# Patient Record
Sex: Male | Born: 2005
Health system: Southern US, Community
[De-identification: ages and names within clinical notes are randomized; demographics above are authoritative.]

## PROBLEM LIST (undated history)

## (undated) DIAGNOSIS — T7840XA Allergy, unspecified, initial encounter: Secondary | ICD-10-CM

## (undated) DIAGNOSIS — F909 Attention-deficit hyperactivity disorder, unspecified type: Secondary | ICD-10-CM

## (undated) HISTORY — DX: Allergy, unspecified, initial encounter: T78.40XA

---
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
## 2023-02-02 ENCOUNTER — Ambulatory Visit (INDEPENDENT_AMBULATORY_CARE_PROVIDER_SITE_OTHER): Payer: BC Managed Care – PPO | Admitting: Internal Medicine

## 2023-02-02 ENCOUNTER — Encounter: Payer: Self-pay | Admitting: Internal Medicine

## 2023-02-02 VITALS — BP 120/68 | HR 81 | Temp 97.9°F | Resp 16 | Ht 67.0 in | Wt 150.5 lb

## 2023-02-02 DIAGNOSIS — T7800XA Anaphylactic reaction due to unspecified food, initial encounter: Secondary | ICD-10-CM

## 2023-02-02 NOTE — Progress Notes (Signed)
New Patient Note  RE: Randy [name] MRN: [id number] DOB: July 18, 2006 Date of Office Visit: 02/02/2023  Consult requested by: Harrie Jeans, MD Primary care provider: Harrie Jeans, MD  Chief Complaint: Allergic Reaction (To certain fish and cantaloupe. Social research officer, government entry. )  History of Present Illness: I had the pleasure of seeing Randy [name] for initial evaluation at the [facility] on 02/02/2023. He is a 17 y.o. male, who is referred here by Harrie Jeans, MD for the evaluation of food allergies in the setting of enlisting in the [place] .  History obtained from patient, chart review.  Concern for Food Allergy:  Food of concern: canteloupe, honeydew, Tilapia  History of reaction: facial swelling and rash,  Previous allergy testing yes previously allergic to peanut butter, but eats this now. Has not had testing for canteloupe or fish  Eats egg, dairy, wheat, soy, shellfish, peanuts, tree nuts, sesame without reactions Tolerates salmon, sushi (unknown fish)  Carries an epinephrine autoinjector: yes (not up to date)  Has food allergy action plan no   Assessment and Plan: Larrell is a 17 y.o. male with: Allergy with anaphylaxis due to food - Plan: Allergen Tilapia f414, Cantaloupe IgE   Plan: Patient Instructions  According to DoDi 6130.03 section 6.23 Food allergy is disqualifying if patient has a "History of acute allergic reaction to fish, crustaceans, shellfish, peanuts, or tree nuts  including the presence of a food-specific immunoglobulin E antibody if accompanied by a  correlating clinical history"  Based on history of reactions: Will get sIgE and plan for food challenges pending results  Follow up: we will contact you with lab results   Thank you so much for letting me partake in your care today.  Don't hesitate to reach out if you have any additional concerns!  Roney Marion, MD  [facility] [place] [place]   No orders of the defined  types were placed in this encounter.  Lab Orders         Allergen Tilapia f414         Cantaloupe IgE      Other allergy screening: Asthma: no Rhino conjunctivitis: no Food allergy: yes Medication allergy: no Hymenoptera allergy: no Urticaria: no Eczema:no History of recurrent infections suggestive of immunodeficency: no  Diagnostics: None done    Past Medical History: Patient Active Problem List   Diagnosis Date Noted   Attention and concentration deficit 03/24/2011   ALLERGY, FOOD 05/19/2010   ECZEMA 01/23/2009   Past Medical History:  Diagnosis Date   ADHD (attention deficit hyperactivity disorder)    Allergy    Multiple food allergies   Past Surgical History: History reviewed. No pertinent surgical history. Medication List:  Current Outpatient Medications  Medication Sig Dispense Refill   ibuprofen (ADVIL) 600 MG tablet Take 1 tablet (600 mg total) by mouth every 8 (eight) hours as needed for up to 30 doses for mild pain or moderate pain. (Patient not taking: Reported on 04/05/2022) 30 tablet 0   ibuprofen (ADVIL) 800 MG tablet Take 1 tablet (800 mg total) by mouth 3 (three) times daily. (Patient not taking: Reported on 02/02/2023) 21 tablet 0   lidocaine (LIDODERM) 5 % Place 1 patch onto the skin daily. Remove & Discard patch within 12 hours or as directed by MD (Patient not taking: Reported on 02/02/2023) 30 patch 0   No current facility-administered medications for this visit.   Allergies: Allergies  Allergen Reactions   Fish-Derived Products  Rash   Other Rash    Melon    Social History: Social History   Socioeconomic History   Marital status: Single    Spouse name: Not on file   Number of children: Not on file   Years of education: Not on file   Highest education level: Not on file  Occupational History   Not on file  Tobacco Use   Smoking status: Never    Passive exposure: Never   Smokeless tobacco: Never  Vaping Use   Vaping Use: Never used   Substance and Sexual Activity   Alcohol use: Not Currently    Alcohol/week: 1.0 - 2.0 standard drink of alcohol    Types: 1 - 2 Standard drinks or equivalent per week    Comment: socailly   Drug use: Not Currently   Sexual activity: Not Currently  Other Topics Concern   Not on file  Social History Narrative   Not on file   Social Determinants of Health   Financial Resource Strain: Not on file  Food Insecurity: Not on file  Transportation Needs: Not on file  Physical Activity: Not on file  Stress: Not on file  Social Connections: Not on file   Lives in a single-family home that is 19 years old.  No roaches in the house and bed is to get off floor.  No dust mite precautions.  Not exposed to fumes, chemicals or dust.  Home is not near an interstate or industrial area. 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   Objective: BP 120/68   Pulse 81   Temp 97.9 F (36.6 C) (Temporal)   Resp 16   Ht '5\' 7"'$  (1.702 m)   Wt 150 lb 8 oz (68.3 kg)   SpO2 99%   BMI 23.57 kg/m  Body mass index is 23.57 kg/m.  General Appearance:  Alert, cooperative, no distress, appears stated age  Head:  Normocephalic, without obvious abnormality, atraumatic  Eyes:  Conjunctiva clear, EOM's intact  Nose: Nares normal,   Throat: Lips, tongue normal; teeth and gums normal,   Neck: Supple, symmetrical  Lungs:   , Respirations unlabored, no coughing  Heart:  , Appears well perfused  Extremities: No edema  Skin: Skin color, texture,  turgor normal, no rashes or lesions on visualized portions of skin  Neurologic: No gross deficits   The plan was reviewed with the patient/family, and all questions/concerned were addressed.  It was my pleasure to see Randy [name] today and participate in his care. Please feel free to contact me with any questions or concerns.  Sincerely,  Roney Marion, MD Allergy & Immunology  [facility] office: [phone] [facility] office: [phone]

## 2023-02-02 NOTE — Patient Instructions (Signed)
According to DoDi 6130.03 section 6.23 Food allergy is disqualifying if patient has a "History of acute allergic reaction to fish, crustaceans, shellfish, peanuts, or tree nuts  including the presence of a food-specific immunoglobulin E antibody if accompanied by a  correlating clinical history"  Based on history of reactions: Will get sIgE and plan for food challenges pending results  Follow up: we will contact you with lab results   Thank you so much for letting me partake in your care today.  Don't hesitate to reach out if you have any additional concerns!  Roney Marion, MD  [facility] [place], [place]

## 2023-02-07 ENCOUNTER — Ambulatory Visit: Payer: Self-pay | Admitting: Internal Medicine

## 2023-02-07 LAB — ALLERGEN CANTALOUPE: Allergen Melon IgE: <0.1 kU/L

## 2023-02-07 LAB — ALLERGEN TILAPIA F414: Allergen Tilapia IgE: <0.1 kU/L

## 2023-02-07 NOTE — Progress Notes (Signed)
Blood work for tilapia and canteloupe was negative.  We need to schedule a 2 food challenges: tilapia first and then canteloupe.  He will need to bring a serving size of both.  Can someone contact patient and let them know?

## 2023-02-09 ENCOUNTER — Telehealth: Payer: Self-pay | Admitting: Internal Medicine

## 2023-02-09 NOTE — Telephone Encounter (Signed)
Tried calling pt but voicemail box is full looks like Randy [name] talked to pts mom about the results and then scheduling a challenge waiting on pt to call us back about challenge

## 2023-02-09 NOTE — Telephone Encounter (Signed)
Patient returning a call  about lab results 

## 2023-02-21 ENCOUNTER — Ambulatory Visit: Payer: Self-pay | Admitting: Internal Medicine

## 2023-02-27 NOTE — Patient Instructions (Signed)
In office oral challenge to tilapia Randy [name] was able to tolerate the tilapia food challenge today at the office without adverse signs or symptoms of an allergic reaction. Therefore, he has the same risk of systemic reaction associated with the consumption of tilapia as the general population.  - Do not give any tilapia  for the next 24 hours. - Monitor for allergic symptoms such as rash, wheezing, diarrhea, swelling, and vomiting for the next 24 hours. If severe symptoms occur, treat with EpiPen injection and call 911. For less severe symptoms treat with Benadryl 50 mg every 4 hours and call the clinic.  - If no allergic symptoms are evident, reintroduce tilapia  into the diet. If he develops an allergic reaction to tilapia , record what was eaten the amount eaten, preparation method, time from ingestion to reaction, and symptoms.   - Return to the clinic for a cantaloupe challenge. Remember to stop taking any antihistamines for 3 days before the challenge.  Call the clinic if this treatment plan is not working well for you  Follow up in 1 week or sooner if needed.

## 2023-02-27 NOTE — Progress Notes (Signed)
   [address] [place] [address] Dept: [phone]  FOLLOW UP NOTE  Patient ID: Randy [name], male    DOB: [birth year]  Age: 17 y.o. MRN: [id number] Date of Office Visit: 02/28/2023  Assessment  Chief Complaint: No chief complaint on file.  HPI Randy [name] is an 17 year old male who presents to the clinic for a follow up visit with a food challenge to tilapia. He was last seen in this clinic on 02/02/2023 by Dr. Marlynn Perking for evaluation of food allergy to tilapia and cantaloupe. He had negative IgE to tilapia and cantaloupe on 02/07/2023.    Drug Allergies:  Allergies  Allergen Reactions   Fish-Derived Products Rash   Other Rash    Melon     Physical Exam: There were no vitals taken for this visit.   Physical Exam  Diagnostics: Procedure note: Writtenc onsent obtained {Blank single:19197::"Open graded *** challenge","Open graded *** oral challenge"}: The patient was able to tolerate the challenge today without adverse signs or symptoms. Vital signs were stable throughout the challenge and observation period. He received multiple doses separated by {Blank single:19197::"30 minutes","20 minutes","15 minutes","10 minutes"}, each of which was separated by vitals and a brief physical exam. He received the following doses: lip rub, 1 gm, 2 gm, 4 gm, 8 gm, and 16 gm. He was monitored for 60 minutes following the last dose.   The patient had {Blank single:19197::"***","negative skin prick test and sIgE tests to ***","negative sIgE tests to ***","negative skin prick tests to ***"} and was able to tolerate the open graded oral challenge today without adverse signs or symptoms. Therefore, he has the same risk of systemic reaction associated with {Blank single:19197::"***","the consumption of ***"} as the general population.    Assessment and Plan: No diagnosis found.  No orders of the defined types were placed in this encounter.   There are no Patient Instructions on file  for this visit.  No follow-ups on file.    Thank you for the opportunity to care for this patient.  Please do not hesitate to contact me with questions.  Thermon Leyland, FNP [facility]

## 2023-02-28 ENCOUNTER — Encounter: Payer: Self-pay | Admitting: Family Medicine

## 2023-02-28 ENCOUNTER — Other Ambulatory Visit: Payer: Self-pay

## 2023-02-28 ENCOUNTER — Ambulatory Visit (INDEPENDENT_AMBULATORY_CARE_PROVIDER_SITE_OTHER): Payer: BC Managed Care – PPO | Admitting: Family Medicine

## 2023-02-28 VITALS — BP 110/72 | HR 76 | Temp 98.0°F | Resp 20 | Wt 152.6 lb

## 2023-02-28 DIAGNOSIS — T7803XD Anaphylactic reaction due to other fish, subsequent encounter: Secondary | ICD-10-CM

## 2023-02-28 DIAGNOSIS — T7803XA Anaphylactic reaction due to other fish, initial encounter: Secondary | ICD-10-CM | POA: Diagnosis not present

## 2023-03-06 NOTE — Patient Instructions (Incomplete)
Monson was able to tolerate the cantaloupe food challenge today at the office without adverse signs or symptoms of an allergic reaction. Therefore, he has the same risk of systemic reaction associated with the consumption of cantaloupe products as the general population.  - Do not give any cantaloupe products for the next 24 hours. - Monitor for allergic symptoms such as rash, wheezing, diarrhea, swelling, and vomiting for the next 24 hours. If severe symptoms occur, treat with EpiPen injection and call 911. For less severe symptoms treat with Benadryl 4 teaspoonfuls every 4-6 hours and call the clinic.  - If no allergic symptoms are evident, reintroduce cantaloupe products into the diet, 1-2 servings a day. If he develops an allergic reaction to cantaloupe products, record what was eaten the amount eaten, preparation method, time from ingestion to reaction, and symptoms.   According to DoDi 6130.03 section 6.23 Food allergy is disqualifying if patient has a "History of acute allergic reaction to fish, crustaceans, shellfish, peanuts, or tree nuts  including the presence of a food-specific immunoglobulin E antibody if accompanied by a  correlating clinical history"  Vishnu passed oral food challenge to cantaloupe today  He is  cleared for this food allergy.  He can eat in a cafeteria style environmental, subsist on MREs for 2 weeks, be deployed to world wide environment.  He has no indication for food avoidance or epipen use.  He has no indication for allergy follow up.   Follow up as needed  He meets accessions criteria per DoDi 6130.03 section 6.23

## 2023-03-07 ENCOUNTER — Encounter: Payer: Self-pay | Admitting: Family

## 2023-03-07 ENCOUNTER — Ambulatory Visit (INDEPENDENT_AMBULATORY_CARE_PROVIDER_SITE_OTHER): Payer: BC Managed Care – PPO | Admitting: Family

## 2023-03-07 ENCOUNTER — Other Ambulatory Visit: Payer: Self-pay

## 2023-03-07 VITALS — BP 114/76 | HR 58 | Temp 98.1°F | Resp 18 | Wt 154.3 lb

## 2023-03-07 DIAGNOSIS — T7800XA Anaphylactic reaction due to unspecified food, initial encounter: Secondary | ICD-10-CM

## 2023-03-07 NOTE — Progress Notes (Signed)
[address] [place] [address] Dept: [phone]  FOLLOW UP NOTE  Patient ID: Randy [name], male    DOB: 08/20/2006  Age: 17 y.o. MRN: [id number] Date of Office Visit: 03/07/2023  Assessment  Chief Complaint: No chief complaint on file.  HPI Randy [name] is an 17 year old male who presents today for an in office oral food challenge to cantaloupe.  He was last seen on February 28, 2023 by Thermon Leyland, FNP.  At that office visit he passed the oral food challenge to tilapia.  He denies any new diagnosis or surgery since his last office visit.  His grandfather is here with him today.  Allergy with anaphylaxis due to food: He reports that when he was around 69 or 17 years old he developed facial swelling and rash with cantaloupe.  He has been off all antihistamines for the past 3 days and reports that he is in good health.  He denies any cardiorespiratory, gastrointestinal, cutaneous symptoms.  All questions answered and informed consent signed.   Drug Allergies:  Allergies  Allergen Reactions   Other Rash    Melon     Review of Systems: Review of Systems  Constitutional:  Negative for chills and fever.  HENT:         Denies rhinorrhea, nasal congestion, and post nasal drip  Eyes:        Denies itchy watery eyes  Respiratory:  Negative for cough, shortness of breath and wheezing.   Cardiovascular:  Negative for chest pain and palpitations.  Gastrointestinal:  Negative for abdominal pain, diarrhea, nausea and vomiting.  Skin:  Negative for itching and rash.  Neurological:  Negative for headaches.     Physical Exam: BP 114/76 (BP Location: Left Arm, Patient Position: Sitting, Cuff Size: Normal)   Pulse (!) 58   Temp 98.1 F (36.7 C) (Temporal)   Resp 18   Wt 154 lb 4.8 oz (70 kg)   SpO2 100%   BMI 24.17 kg/m    Physical Exam Exam conducted with a chaperone present.  Constitutional:      Appearance: Normal appearance. He is normal weight.  HENT:     Head:  Normocephalic and atraumatic.     Comments: Pharynx normal. Eyes normal. Ears normal. Nose normal    Right Ear: Tympanic membrane, ear canal and external ear normal.     Left Ear: Tympanic membrane, ear canal and external ear normal.     Nose: Nose normal.     Mouth/Throat:     Mouth: Mucous membranes are moist.     Pharynx: Oropharynx is clear.  Eyes:     Conjunctiva/sclera: Conjunctivae normal.  Cardiovascular:     Rate and Rhythm: Regular rhythm.     Pulses: Normal pulses.     Heart sounds: Normal heart sounds.  Pulmonary:     Effort: Pulmonary effort is normal.     Breath sounds: Normal breath sounds.     Comments: Lungs clear to auscultation Musculoskeletal:     Cervical back: Neck supple.  Skin:    General: Skin is warm.     Comments: No rashes or urticarial lesions noted  Neurological:     Mental Status: He is alert and oriented to person, place, and time.  Psychiatric:        Mood and Affect: Mood normal.        Behavior: Behavior normal.        Thought Content: Thought content normal.  Judgment: Judgment normal.     Diagnostics:  Open graded cantaloupe oral challenge: The patient was able to tolerate the challenge today without adverse signs or symptoms. Vital signs were stable throughout the challenge and observation period. He received multiple doses separated by 15 minutes, each of which was separated by vitals and a brief physical exam. He received the following doses: lip rub, 1/12 serving, 1/6 serving, 1/4 serving, and 1/2 serving. Total serving size: 1/2 cup. He was monitored for 60 minutes following the last dose.   The patient had negative sIgE tests to cantaloupe  and was able to tolerate the open graded oral challenge today without adverse signs or symptoms. Therefore, he has the same risk of systemic reaction associated with the consumption of cantaloupe  as the general population.   Assessment and Plan: 1. Allergy with anaphylaxis due to food      No orders of the defined types were placed in this encounter.   Patient Instructions  Buck was able to tolerate the cantaloupe food challenge today at the office without adverse signs or symptoms of an allergic reaction. Therefore, he has the same risk of systemic reaction associated with the consumption of cantaloupe products as the general population.  - Do not give any cantaloupe products for the next 24 hours. - Monitor for allergic symptoms such as rash, wheezing, diarrhea, swelling, and vomiting for the next 24 hours. If severe symptoms occur, treat with EpiPen injection and call 911. For less severe symptoms treat with Benadryl 4 teaspoonfuls every 4-6 hours and call the clinic.  - If no allergic symptoms are evident, reintroduce cantaloupe products into the diet, 1-2 servings a day. If he develops an allergic reaction to cantaloupe products, record what was eaten the amount eaten, preparation method, time from ingestion to reaction, and symptoms.   According to DoDi 6130.03 section 6.23 Food allergy is disqualifying if patient has a "History of acute allergic reaction to fish, crustaceans, shellfish, peanuts, or tree nuts  including the presence of a food-specific immunoglobulin E antibody if accompanied by a  correlating clinical history"  [name] passed oral food challenge to cantaloupe today  He is  cleared for this food allergy.  He can eat in a cafeteria style environmental, subsist on MREs for 2 weeks, be deployed to world wide environment.  He has no indication for food avoidance or epipen use.  He has no indication for allergy follow up.   Follow up as needed  He meets accessions criteria per DoDi 6130.03 section 6.23  Return if symptoms worsen or fail to improve.    Thank you for the opportunity to care for this patient.  Please do not hesitate to contact me with questions.  Nehemiah Settle, FNP [facility]

## 2023-03-09 ENCOUNTER — Telehealth: Payer: Self-pay | Admitting: Family

## 2023-03-09 NOTE — Telephone Encounter (Signed)
Patient called and asked if he can get his clinical notes from Tuesday appointment.

## 2023-03-09 NOTE — Telephone Encounter (Signed)
Notes printed pt will have to sign a medical release lm explaining this and sending this ti nicole for prop documenting

## 2023-05-31 ENCOUNTER — Institutional Professional Consult (permissible substitution): Payer: BC Managed Care – PPO | Admitting: Family

## 2024-07-21 ENCOUNTER — Other Ambulatory Visit: Payer: Self-pay

## 2024-07-21 ENCOUNTER — Encounter (HOSPITAL_BASED_OUTPATIENT_CLINIC_OR_DEPARTMENT_OTHER): Payer: Self-pay

## 2024-07-21 ENCOUNTER — Emergency Department (HOSPITAL_BASED_OUTPATIENT_CLINIC_OR_DEPARTMENT_OTHER)
Admission: EM | Admit: 2024-07-21 | Discharge: 2024-07-21 | Disposition: A | Discharge status: Home / Self Care | Payer: Self-pay | Attending: Emergency Medicine | Admitting: Emergency Medicine

## 2024-07-21 DIAGNOSIS — W3400XA Accidental discharge from unspecified firearms or gun, initial encounter: Secondary | ICD-10-CM | POA: Insufficient documentation

## 2024-07-21 DIAGNOSIS — S01511A Laceration without foreign body of lip, initial encounter: Secondary | ICD-10-CM | POA: Diagnosis present

## 2024-07-21 MED ORDER — AMOXICILLIN 500 MG PO CAPS
500.0000 mg | ORAL_CAPSULE | Freq: Once | ORAL | Status: AC
Start: 2024-07-21 — End: 2024-07-21
  Administered 2024-07-21: 500 mg via ORAL
  Filled 2024-07-21: qty 1

## 2024-07-21 MED ORDER — LIDOCAINE HCL 1 % IJ SOLN
INTRAMUSCULAR | Status: AC
  Filled 2024-07-21: qty 20

## 2024-07-21 MED ORDER — AMOXICILLIN 500 MG PO CAPS: 500.0000 mg | ORAL_CAPSULE | Freq: Three times a day (TID) | ORAL | 0 refills | Status: DC
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
## 2024-07-21 NOTE — Discharge Instructions (Addendum)
 Wash mouth out with peroxide and water. Return if swelling or drainage occurs. See dentist if there are any problems with the teeth. The stitches will dissolve - they do not need to be removed.
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
## 2024-10-29 ENCOUNTER — Emergency Department (HOSPITAL_BASED_OUTPATIENT_CLINIC_OR_DEPARTMENT_OTHER)
Admission: EM | Admit: 2024-10-29 | Discharge: 2024-10-29 | Disposition: A | Discharge status: Home / Self Care | Payer: Worker's Compensation | Attending: Emergency Medicine | Admitting: Emergency Medicine

## 2024-10-29 ENCOUNTER — Other Ambulatory Visit: Payer: Self-pay

## 2024-10-29 ENCOUNTER — Encounter (HOSPITAL_BASED_OUTPATIENT_CLINIC_OR_DEPARTMENT_OTHER): Payer: Self-pay

## 2024-10-29 DIAGNOSIS — S0181XA Laceration without foreign body of other part of head, initial encounter: Secondary | ICD-10-CM

## 2024-10-29 MED ORDER — LIDOCAINE HCL (PF) 1 % IJ SOLN
5.0000 mL | Freq: Once | INTRAMUSCULAR | Status: AC
  Administered 2024-10-29: 5 mL
  Filled 2024-10-29: qty 5

## 2024-10-29 NOTE — ED Provider Notes (Signed)
 [facility] Provider Note   CSN: [id number] Arrival date & time: 10/29/24  0028     Patient presents with: Facial Laceration   Randy [name] is a 18 y.o. male.   Patient is a 18 year old male presenting with a facial laceration.  He was at work this evening when he struck his head on a loading dock on a [name] of metal.  He has a laceration above his left eye.  There was no loss of consciousness and he denies any visual disturbances.       Prior to Admission medications   Medication Sig Start Date End Date Taking? Authorizing Provider  amoxicillin  (AMOXIL ) 500 MG capsule Take 1 capsule (500 mg total) by mouth 3 (three) times daily. 07/21/24   Haze Lonni PARAS, MD  ibuprofen  (ADVIL ) 600 MG tablet Take 1 tablet (600 mg total) by mouth every 8 (eight) hours as needed for up to 30 doses for mild pain or moderate pain. Patient not taking: Reported on 04/05/2022 02/10/22   Cottie Donnice PARAS, MD  ibuprofen  (ADVIL ) 800 MG tablet Take 1 tablet (800 mg total) by mouth 3 (three) times daily. Patient not taking: Reported on 02/02/2023 08/14/22   Palumbo, April, MD  lidocaine  (LIDODERM ) 5 % Place 1 patch onto the skin daily. Remove & Discard patch within 12 hours or as directed by MD Patient not taking: Reported on 02/02/2023 08/14/22   Palumbo, April, MD    Allergies: Other    Review of Systems  All other systems reviewed and are negative.   Updated Vital Signs BP 129/78   Pulse 73   Temp 97.9 F (36.6 C) (Oral)   Resp 16   Ht 5' 7 (1.702 m)   Wt 67.6 kg   SpO2 100%   BMI 23.34 kg/m   Physical Exam Vitals and nursing note reviewed.  Constitutional:      Appearance: Normal appearance.  HENT:     Head: Normocephalic.     Comments: There is a 2.5 cm laceration noted below the left eyebrow. Eyes:     Extraocular Movements: Extraocular movements intact.     Pupils: Pupils are equal, round, and reactive to light.  Pulmonary:      Effort: Pulmonary effort is normal.  Neurological:     General: No focal deficit present.     Mental Status: He is alert and oriented to person, place, and time.     Cranial Nerves: No cranial nerve deficit.     Motor: No weakness.     (all labs ordered are listed, but only abnormal results are displayed) Labs Reviewed - No data to display  EKG: None  Radiology: No results found.   Procedures  LACERATION REPAIR Performed by: Vicenta Able Authorized by: Vicenta Able Consent: Verbal consent obtained. Risks and benefits: risks, benefits and alternatives were discussed Consent given by: patient Patient identity confirmed: provided demographic data Prepped and Draped in normal sterile fashion Wound explored  Laceration Location: Left eyebrow  Laceration Length: 2.5 cm  No Foreign Bodies seen or palpated  Anesthesia: local infiltration  Local anesthetic: lidocaine  1% without epinephrine  Anesthetic total: 2 ml  Irrigation method: syringe Amount of cleaning: standard  Skin closure: 6-0 Prolene  Number of sutures: 3  Technique: Simple interrupted  Patient tolerance: Patient tolerated the procedure well with no immediate complications.   Medications Ordered in the ED  lidocaine  (PF) (XYLOCAINE ) 1 % injection 5 mL (has no administration in time  range)                                    Medical Decision Making Risk Prescription drug management.   Patient presenting with a facial laceration as described in the HPI.  This was repaired as described above.  Patient will be discharged with local wound care and as needed return.  He is neurologically intact with no loss of consciousness and no headache.  I do not feel as though any imaging is indicated.  There is also no evidence on exam for ocular or orbital injury.     Final diagnoses:  None    ED Discharge Orders     None          Geroldine Berg, MD 10/29/24 [phone]

## 2024-10-29 NOTE — ED Triage Notes (Addendum)
 Laceration to left eyelid that happened today at work. Pt also hit left side of hip and has a small abrasion. Ambulatory with no difficulties. NO LOC, no blood thinners. Denies headache. Unsure if tetanus is up to date

## 2024-10-29 NOTE — Discharge Instructions (Signed)
 Local wound care with bacitracin and Band-Aid changed twice daily.  Sutures are to be removed in 5 to 7 days.  Please follow-up with your primary doctor or urgent care for this.  Return to the ER in the meantime if you develop any new and/or concerning issues.

## 2024-11-20 ENCOUNTER — Ambulatory Visit: Admission: EM | Admit: 2024-11-20 | Discharge: 2024-11-20 | Discharge status: Against Medical Advice

## 2024-11-20 NOTE — ED Notes (Signed)
 Pt called 3x no response

## 2024-11-20 NOTE — ED Notes (Signed)
Called x's 3 without response 

## 2024-11-20 NOTE — ED Triage Notes (Signed)
 Called pt in lobby 3 x no response.

## 2024-11-21 ENCOUNTER — Ambulatory Visit
Admission: EM | Admit: 2024-11-21 | Discharge: 2024-11-21 | Disposition: A | Discharge status: Home / Self Care | Source: Home / Self Care

## 2024-11-21 DIAGNOSIS — W19XXXA Unspecified fall, initial encounter: Secondary | ICD-10-CM

## 2024-11-21 DIAGNOSIS — S300XXA Contusion of lower back and pelvis, initial encounter: Secondary | ICD-10-CM | POA: Diagnosis not present

## 2024-11-21 NOTE — Discharge Instructions (Addendum)
 Use warm compresses such as a heating pad as needed Take Tylenol  Motrin  as needed for pain May return back to full duty If symptoms become worse you will need to follow-up with orthopedic after 7 days

## 2024-11-21 NOTE — ED Triage Notes (Signed)
 Pt states he fell 2 days ago states he is having pain to his tailbone and his right butt.  States he used a lidocaine  patch on it with some relief. States he needs a note that he can return to work.

## 2024-11-21 NOTE — ED Provider Notes (Signed)
 " [facility]    CSN: [id number] Arrival date & time: 11/21/24  9196      History   Chief Complaint Chief Complaint  Patient presents with   Fall    HPI Randy [name] is a 19 y.o. male.   Patient presents today for manage 2 days ago states that he has pain to the tailbone on the right side.  Patient states that he feels like he is getting better does not need an x-ray he is requesting work note to return back to full duty..  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 No pertinent surgical history.     Home Medications    Prior to Admission medications  Medication Sig Start Date End Date Taking? Authorizing Provider  amoxicillin  (AMOXIL ) 500 MG capsule Take 1 capsule (500 mg total) by mouth 3 (three) times daily. 07/21/24   Haze Lonni PARAS, MD  ibuprofen  (ADVIL ) 600 MG tablet Take 1 tablet (600 mg total) by mouth every 8 (eight) hours as needed for up to 30 doses for mild pain or moderate pain. Patient not taking: Reported on 04/05/2022 02/10/22   Cottie Donnice PARAS, MD  ibuprofen  (ADVIL ) 800 MG tablet Take 1 tablet (800 mg total) by mouth 3 (three) times daily. Patient not taking: Reported on 02/02/2023 08/14/22   Palumbo, April, MD  lidocaine  (LIDODERM ) 5 % Place 1 patch onto the skin daily. Remove & Discard patch within 12 hours or as directed by MD Patient not taking: Reported on 02/02/2023 08/14/22   Palumbo, April, MD    Family History Family History  Problem Relation Age of Onset   Healthy Mother    Healthy Father    Diabetes Maternal Grandmother    Hypertension Maternal Aunt     Social  History Social History[1]   Allergies   Other   Review of Systems Review of Systems  Constitutional: Negative.   Respiratory: Negative.    Cardiovascular: Negative.   Gastrointestinal: Negative.   Musculoskeletal:        Pain and tenderness to the lower tailbone area  Neurological: Negative.      Physical Exam Triage Vital Signs ED Triage Vitals  Encounter Vitals Group     BP 11/21/24 0825 119/74     Girls Systolic BP Percentile --      Girls Diastolic BP Percentile --      Boys Systolic BP Percentile --      Boys Diastolic BP Percentile --      Pulse Rate 11/21/24 0825 (!) 52     Resp 11/21/24 0825 16     Temp 11/21/24 0825 97.9 F (36.6 C)     Temp Source 11/21/24 0825 Oral     SpO2 11/21/24 0825 98 %     Weight --      Height --      Head Circumference --      Peak Flow --      Pain Score 11/21/24 0824 6     Pain Loc --      Pain Education --  Exclude from Growth Chart --    No data found.  Updated Vital Signs BP 119/74 (BP Location: Left Arm)   Pulse (!) 52   Temp 97.9 F (36.6 C) (Oral)   Resp 16   SpO2 98%   Visual Acuity Right Eye Distance:   Left Eye Distance:   Bilateral Distance:    Right Eye Near:   Left Eye Near:    Bilateral Near:     Physical Exam Cardiovascular:     Rate and Rhythm: Normal rate.  Pulmonary:     Effort: Pulmonary effort is normal.  Abdominal:     General: Abdomen is flat.  Musculoskeletal:        General: Tenderness present.     Cervical back: Normal range of motion.     Comments: Tenderness to the right tailbone able to extend without difficulties.  Able to walk around the room without difficulties  Skin:    General: Skin is warm.  Neurological:     General: No focal deficit present.     Mental Status: He is alert.      UC Treatments / Results  Labs (all labs ordered are listed, but only abnormal results are displayed) Labs Reviewed - No data to display  EKG   Radiology No results  found.  Procedures Procedures (including critical care time)  Medications Ordered in UC Medications - No data to display  Initial Impression / Assessment and Plan / UC Course  I have reviewed the triage vital signs and the nursing notes.  Pertinent labs & imaging results that were available during my care of the patient were reviewed by me and considered in my medical decision making (see chart for details).     Use warm compresses such as a heating pad as needed Take Tylenol  Motrin  as needed for pain May return back to full duty If symptoms become worse you will need to follow-up with orthopedic after 7 days Final Clinical Impressions(s) / UC Diagnoses   Final diagnoses:  Fall, initial encounter  Contusion, buttock, initial encounter     Discharge Instructions      Use warm compresses such as a heating pad as needed Take Tylenol  Motrin  as needed for pain May return back to full duty If symptoms become worse you will need to follow-up with orthopedic after 7 days     ED Prescriptions   None    PDMP not reviewed this encounter.    [1]  Social History Tobacco Use   Smoking status: Never    Passive exposure: Never   Smokeless tobacco: Never  Vaping Use   Vaping status: Never Used  Substance Use Topics   Alcohol use: Not Currently    Alcohol/week: 1.0 - 2.0 standard drink of alcohol    Types: 1 - 2 Standard drinks or equivalent per week    Comment: socailly   Drug use: Not Currently     Merilee Andrea CROME, NP 11/21/24 9147  "

## 2024-11-30 ENCOUNTER — Other Ambulatory Visit: Payer: Self-pay

## 2024-11-30 ENCOUNTER — Encounter: Payer: Self-pay | Admitting: *Deleted

## 2024-11-30 ENCOUNTER — Ambulatory Visit
Admission: EM | Admit: 2024-11-30 | Discharge: 2024-11-30 | Disposition: A | Discharge status: Home / Self Care | Attending: Family Medicine | Admitting: Family Medicine

## 2024-11-30 ENCOUNTER — Ambulatory Visit (INDEPENDENT_AMBULATORY_CARE_PROVIDER_SITE_OTHER)

## 2024-11-30 ENCOUNTER — Ambulatory Visit: Payer: Self-pay | Admitting: Nurse Practitioner

## 2024-11-30 DIAGNOSIS — R051 Acute cough: Secondary | ICD-10-CM

## 2024-11-30 DIAGNOSIS — J22 Unspecified acute lower respiratory infection: Secondary | ICD-10-CM | POA: Diagnosis not present

## 2024-11-30 DIAGNOSIS — R062 Wheezing: Secondary | ICD-10-CM

## 2024-11-30 DIAGNOSIS — R112 Nausea with vomiting, unspecified: Secondary | ICD-10-CM | POA: Diagnosis not present

## 2024-11-30 LAB — POCT RAPID STREP A (OFFICE): Rapid Strep A Screen: NEGATIVE

## 2024-11-30 MED ORDER — PREDNISONE 20 MG PO TABS: 40.0000 mg | ORAL_TABLET | Freq: Every day | ORAL | 0 refills | Status: AC

## 2024-11-30 MED ORDER — ONDANSETRON 4 MG PO TBDP: 4.0000 mg | ORAL_TABLET | Freq: Three times a day (TID) | ORAL | 0 refills | Status: AC | PRN

## 2024-11-30 MED ORDER — AZITHROMYCIN 250 MG PO TABS: 250.0000 mg | ORAL_TABLET | Freq: Every day | ORAL | 0 refills | Status: AC

## 2024-11-30 MED ORDER — AMOXICILLIN-POT CLAVULANATE 875-125 MG PO TABS: 1.0000 | ORAL_TABLET | Freq: Two times a day (BID) | ORAL | 0 refills | Status: AC

## 2024-11-30 MED ORDER — ALBUTEROL SULFATE HFA 108 (90 BASE) MCG/ACT IN AERS: 1.0000 | INHALATION_SPRAY | Freq: Four times a day (QID) | RESPIRATORY_TRACT | 0 refills | Status: AC | PRN

## 2024-11-30 MED ORDER — BENZONATATE 200 MG PO CAPS: 200.0000 mg | ORAL_CAPSULE | Freq: Three times a day (TID) | ORAL | 0 refills | Status: AC | PRN

## 2024-11-30 MED ORDER — IPRATROPIUM-ALBUTEROL 0.5-2.5 (3) MG/3ML IN SOLN
3.0000 mL | Freq: Once | RESPIRATORY_TRACT | Status: AC
  Administered 2024-11-30: 3 mL via RESPIRATORY_TRACT

## 2024-11-30 MED ORDER — ONDANSETRON 4 MG PO TBDP
4.0000 mg | ORAL_TABLET | Freq: Once | ORAL | Status: AC
  Administered 2024-11-30: 4 mg via ORAL

## 2024-11-30 NOTE — Discharge Instructions (Addendum)
 Start azithromycin  antibiotic and Augmentin  antibiotic as prescribed for your pneumonia.  May take Tessalon  3 times a day as needed for your cough.  Start prednisone  daily for 5 days.  Extension albuterol  inhaler to use as needed for wheezing or shortness of breath.  He may take Zofran  every 8 hours as needed for nausea or vomiting.  You were given a dose while in the clinic.  Lots of rest and fluids.  Please follow-up with your PCP in 4 weeks for repeat chest x-ray to ensure resolution of the infection.  Please go to the ER if you develop any worsening symptoms.  Hope you feel better soon!

## 2024-11-30 NOTE — ED Triage Notes (Addendum)
 C/O ongoing nausea x approx 1 wk with sore throat and intermittent abd discomfort. Unsure if fevers. Denies diarrhea. States able to keep down PO fluids. Has been taking OTC sore throat med and Dayquil. Admits to cough x approx 1 wk as well.

## 2024-11-30 NOTE — ED Provider Notes (Signed)
 " [facility]    CSN: [id number] Arrival date & time: 11/30/24  1254      History   Chief Complaint Chief Complaint  Patient presents with   Nausea   Sore Throat    HPI Randy [name] is a 19 y.o. male  presents for evaluation of URI symptoms for 7 days. Patient reports associated symptoms of cough, congestion, sore throat, nausea vomiting, fevers, shortness of breath. Denies diarrhea, ear pain. Patient does not have a hx of asthma. Patient is an active smoker.   Reports sick contacts via girlfriend.  States he is able to stay hydrated. pt has taken DayQuil NyQuil OTC for symptoms. Pt has no other concerns at this time.    Sore Throat Associated symptoms include shortness of breath.    Past Medical History:  Diagnosis Date   ADHD (attention deficit hyperactivity disorder)    Allergy    Multiple food allergies    Patient Active Problem List   Diagnosis Date Noted   Anaphylactic shock due to fish 02/28/2023   Attention and concentration deficit 03/24/2011   ALLERGY, FOOD 05/19/2010   ECZEMA 01/23/2009    History reviewed. No pertinent surgical history.     Home Medications    Prior to Admission medications  Medication Sig Start Date End Date Taking? Authorizing Provider  albuterol  (VENTOLIN  HFA) 108 (90 Base) MCG/ACT inhaler Inhale 1-2 puffs into the lungs every 6 (six) hours as needed. 11/30/24  Yes Evalena Fujii, Jodi R, NP  amoxicillin -clavulanate (AUGMENTIN ) 875-125 MG tablet Take 1 tablet by mouth every 12 (twelve) hours. 11/30/24  Yes Katelee Schupp, Jodi R, NP  azithromycin  (ZITHROMAX ) 250 MG tablet Take 1 tablet (250 mg total) by mouth daily. Take first 2 tablets together, then 1 every day until finished. 11/30/24  Yes Lorel Lembo, Jodi R, NP  benzonatate  (TESSALON ) 200 MG capsule Take 1 capsule (200 mg total) by mouth 3 (three) times daily as needed. 11/30/24  Yes Tyner Codner, Jodi R, NP  ondansetron  (ZOFRAN -ODT) 4 MG disintegrating tablet Take 1 tablet (4 mg total) by mouth  every 8 (eight) hours as needed for nausea or vomiting. 11/30/24  Yes Kimbrely Buckel, Jodi R, NP  predniSONE  (DELTASONE ) 20 MG tablet Take 2 tablets (40 mg total) by mouth daily with breakfast for 5 days. 11/30/24 12/05/24 Yes Loreda Myla SAUNDERS, NP    Family History Family History  Problem Relation Age of Onset   Healthy Mother    Healthy Father    Diabetes Maternal Grandmother    Hypertension Maternal Aunt     Social History Social History[1]   Allergies   Other   Review of Systems Review of Systems  Constitutional:  Positive for fever.  HENT:  Positive for congestion and sore throat.   Respiratory:  Positive for cough, shortness of breath and wheezing.   Gastrointestinal:  Positive for nausea and vomiting.     Physical Exam Triage Vital Signs ED Triage Vitals  Encounter Vitals Group     BP 11/30/24 1342 100/65     Girls Systolic BP Percentile --      Girls Diastolic BP Percentile --      Boys Systolic BP Percentile --      Boys Diastolic BP Percentile --      Pulse Rate 11/30/24 1342 89     Resp 11/30/24 1342 16     Temp 11/30/24 1342 99.9 F (37.7 C)     Temp Source 11/30/24 1342 Oral     SpO2 11/30/24 1342 91 %  Weight --      Height --      Head Circumference --      Peak Flow --      Pain Score 11/30/24 1343 8     Pain Loc --      Pain Education --      Exclude from Growth Chart --    No data found.  Updated Vital Signs BP 100/65   Pulse 89   Temp 99.9 F (37.7 C) (Oral)   Resp 16   SpO2 92%   Visual Acuity Right Eye Distance:   Left Eye Distance:   Bilateral Distance:    Right Eye Near:   Left Eye Near:    Bilateral Near:     Physical Exam Vitals and nursing note reviewed.  Constitutional:      General: He is not in acute distress.    Appearance: Normal appearance. He is not ill-appearing or toxic-appearing.  HENT:     Head: Normocephalic and atraumatic.     Right Ear: Tympanic membrane and ear canal normal.     Left Ear: Tympanic membrane  and ear canal normal.     Nose: Congestion present.     Mouth/Throat:     Mouth: Mucous membranes are moist.     Pharynx: Posterior oropharyngeal erythema present.  Eyes:     Pupils: Pupils are equal, round, and reactive to light.  Cardiovascular:     Rate and Rhythm: Normal rate and regular rhythm.     Heart sounds: Normal heart sounds.  Pulmonary:     Effort: Pulmonary effort is normal.     Breath sounds: Wheezing present.  Musculoskeletal:     Cervical back: Normal range of motion and neck supple.  Lymphadenopathy:     Cervical: No cervical adenopathy.  Skin:    General: Skin is warm and dry.  Neurological:     General: No focal deficit present.     Mental Status: He is alert and oriented to person, place, and time.  Psychiatric:        Mood and Affect: Mood normal.        Behavior: Behavior normal.      UC Treatments / Results  Labs (all labs ordered are listed, but only abnormal results are displayed) Labs Reviewed  POCT RAPID STREP A (OFFICE)    EKG   Radiology No results found.  Procedures Procedures (including critical care time)  Medications Ordered in UC Medications  ondansetron  (ZOFRAN -ODT) disintegrating tablet 4 mg (4 mg Oral Given 11/30/24 1347)  ipratropium-albuterol  (DUONEB) 0.5-2.5 (3) MG/3ML nebulizer solution 3 mL (3 mLs Nebulization Given 11/30/24 1358)    Initial Impression / Assessment and Plan / UC Course  I have reviewed the triage vital signs and the nursing notes.  Pertinent labs & imaging results that were available during my care of the patient were reviewed by me and considered in my medical decision making (see chart for details).  Clinical Course as of 11/30/24 1415  Sat Nov 30, 2024  1414 O2 recheck after nebulizer 93% on room [JM]    Clinical Course User Index [JM] Loreda Myla SAUNDERS, NP    Reviewed exam and symptoms with patient.  Wet read of x-ray positive for pneumonia, start Augmentin  and azithromycin .  Albuterol  inhaler as  needed.  Tessalon  as needed for cough.  Will do prednisone  daily for 5 days.  Also sent patient Zofran  for nausea and vomiting.  Advised PCP follow-up 4 weeks for repeat chest x-ray  to ensure resolution of symptoms.  Strict ER precautions reviewed and patient verbalized understanding. Final Clinical Impressions(s) / UC Diagnoses   Final diagnoses:  Acute cough  Wheezing  Lower respiratory infection (e.g., bronchitis, pneumonia, pneumonitis, pulmonitis)  Nausea and vomiting, unspecified vomiting type     Discharge Instructions      Start azithromycin  antibiotic and Augmentin  antibiotic as prescribed for your pneumonia.  May take Tessalon  3 times a day as needed for your cough.  Start prednisone  daily for 5 days.  Extension albuterol  inhaler to use as needed for wheezing or shortness of breath.  He may take Zofran  every 8 hours as needed for nausea or vomiting.  You were given a dose while in the clinic.  Lots of rest and fluids.  Please follow-up with your PCP in 4 weeks for repeat chest x-ray to ensure resolution of the infection.  Please go to the ER if you develop any worsening symptoms.  Hope you feel better soon!     ED Prescriptions     Medication Sig Dispense Auth. Provider   azithromycin  (ZITHROMAX ) 250 MG tablet Take 1 tablet (250 mg total) by mouth daily. Take first 2 tablets together, then 1 every day until finished. 6 tablet Roniesha Hollingshead, Jodi R, NP   amoxicillin -clavulanate (AUGMENTIN ) 875-125 MG tablet Take 1 tablet by mouth every 12 (twelve) hours. 14 tablet Janaisha Tolsma, Jodi R, NP   benzonatate  (TESSALON ) 200 MG capsule Take 1 capsule (200 mg total) by mouth 3 (three) times daily as needed. 20 capsule Okie Bogacz, Jodi R, NP   albuterol  (VENTOLIN  HFA) 108 (90 Base) MCG/ACT inhaler Inhale 1-2 puffs into the lungs every 6 (six) hours as needed. 1 each Loreda Myla SAUNDERS, NP   predniSONE  (DELTASONE ) 20 MG tablet Take 2 tablets (40 mg total) by mouth daily with breakfast for 5 days. 10 tablet Hajra Port,  Jodi R, NP   ondansetron  (ZOFRAN -ODT) 4 MG disintegrating tablet Take 1 tablet (4 mg total) by mouth every 8 (eight) hours as needed for nausea or vomiting. 8 tablet Eugen Jeansonne, Jodi R, NP      PDMP not reviewed this encounter.     [1]  Social History Tobacco Use   Smoking status: Never    Passive exposure: Never   Smokeless tobacco: Never  Vaping Use   Vaping status: Former  Substance Use Topics   Alcohol use: Not Currently    Comment: rare   Drug use: Yes    Types: Marijuana     Loreda Myla SAUNDERS, NP 11/30/24 1416  "

## 2024-12-18 NOTE — Progress Notes (Unsigned)
 " [facility] [address] [place] [address] Dept: [phone] Dept Fax: [phone]  New Patient Office Visit  Subjective:   Randy [name] 28-Jun-2006 12/19/2024  No chief complaint on file.   HPI: Randy [name] presents today to establish care at [facility]. Introduced to publishing rights manager role and practice setting.  All questions answered.  Concerns: See below    The following portions of the patient's history were reviewed and updated as appropriate: past medical history, past surgical history, family history, social history, allergies, medications, and problem list.   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 Family History  Problem Relation Age of Onset   Healthy Mother    Healthy Father    Diabetes Maternal Grandmother    Hypertension Maternal Aunt    Current Medications[1] Allergies[2]  ROS: A complete ROS was performed with pertinent positives/negatives noted in the HPI. The remainder of the ROS are negative.   Objective:   There were no vitals filed for this visit.  GENERAL: Well-appearing, in NAD. Well nourished.  SKIN: Pink, warm and dry. No rash, lesion, ulceration, or ecchymoses.  NECK: Trachea midline. Full ROM w/o pain or tenderness. No lymphadenopathy.  RESPIRATORY: Chest wall symmetrical. Respirations even and non-labored. Breath sounds clear to auscultation bilaterally.  CARDIAC: S1, S2 present, regular rate and rhythm. Peripheral pulses 2+ bilaterally.  EXTREMITIES: Without clubbing, cyanosis, or edema.  NEUROLOGIC: No motor or sensory  deficits. Steady, even gait.  PSYCH/MENTAL STATUS: Alert, oriented x 3. Cooperative, appropriate mood and affect.   Health Maintenance Due  Topic Date Due   COVID-19 Vaccine (1) Never done   HPV VACCINES (1 - Male 3-dose series) Never done   HIV Screening  Never done   Meningococcal B Vaccine (1 of 2 - Standard) Never done   Hepatitis C Screening  Never done   Influenza Vaccine  06/21/2024    No results found for any visits on 12/19/24.  Assessment & Plan:   There are no diagnoses linked to this encounter.  No orders of the defined types were placed in this encounter.  No orders of the defined types were placed in this encounter.   No follow-ups on file.   Rosina Senters, FNP    [1]  Current Outpatient Medications:    albuterol  (VENTOLIN  HFA) 108 (90 Base) MCG/ACT inhaler, Inhale 1-2 puffs into the lungs every 6 (six) hours as needed., Disp: 1 each, Rfl: 0   amoxicillin -clavulanate (AUGMENTIN ) 875-125 MG tablet, Take 1 tablet by mouth every 12 (twelve) hours., Disp: 14 tablet, Rfl: 0   azithromycin  (ZITHROMAX ) 250 MG tablet, Take 1 tablet (250 mg total) by mouth daily. Take first 2 tablets together, then 1 every day until finished., Disp: 6 tablet, Rfl: 0   benzonatate  (TESSALON ) 200 MG capsule, Take 1 capsule (200 mg total) by mouth 3 (three) times daily as needed., Disp: 20 capsule, Rfl: 0   ondansetron  (ZOFRAN -ODT) 4 MG disintegrating tablet, Take 1 tablet (4 mg total) by mouth every 8 (eight) hours as needed for nausea or vomiting., Disp: 8 tablet, Rfl: 0 [2]  Allergies Allergen  Reactions   Other Rash    Melon    "

## 2024-12-19 ENCOUNTER — Ambulatory Visit: Admitting: Internal Medicine
